# Patient Record
Sex: Male | Born: 2000 | Race: Black or African American | Hispanic: No | Marital: Single | Smoking: Never smoker
Health system: Southern US, Community
[De-identification: ages and names within clinical notes are randomized; demographics above are authoritative.]

## PROBLEM LIST (undated history)

## (undated) ENCOUNTER — Emergency Department (HOSPITAL_COMMUNITY): Admission: EM | Payer: Medicaid Other | Source: Home / Self Care

## (undated) DIAGNOSIS — J45909 Unspecified asthma, uncomplicated: Secondary | ICD-10-CM

---
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
## 2022-04-30 ENCOUNTER — Emergency Department (HOSPITAL_BASED_OUTPATIENT_CLINIC_OR_DEPARTMENT_OTHER): Payer: Medicaid Other | Admitting: Radiology

## 2022-04-30 ENCOUNTER — Encounter (HOSPITAL_BASED_OUTPATIENT_CLINIC_OR_DEPARTMENT_OTHER): Payer: Self-pay | Admitting: Emergency Medicine

## 2022-04-30 ENCOUNTER — Emergency Department (HOSPITAL_BASED_OUTPATIENT_CLINIC_OR_DEPARTMENT_OTHER)
Admission: EM | Admit: 2022-04-30 | Discharge: 2022-04-30 | Disposition: A | Discharge status: Home / Self Care | Payer: Medicaid Other | Attending: Emergency Medicine | Admitting: Emergency Medicine

## 2022-04-30 ENCOUNTER — Emergency Department (HOSPITAL_BASED_OUTPATIENT_CLINIC_OR_DEPARTMENT_OTHER): Payer: Medicaid Other

## 2022-04-30 ENCOUNTER — Other Ambulatory Visit: Payer: Self-pay

## 2022-04-30 DIAGNOSIS — S30811A Abrasion of abdominal wall, initial encounter: Secondary | ICD-10-CM | POA: Insufficient documentation

## 2022-04-30 DIAGNOSIS — S3991XA Unspecified injury of abdomen, initial encounter: Secondary | ICD-10-CM | POA: Diagnosis present

## 2022-04-30 DIAGNOSIS — Y92410 Unspecified street and highway as the place of occurrence of the external cause: Secondary | ICD-10-CM | POA: Insufficient documentation

## 2022-04-30 DIAGNOSIS — M542 Cervicalgia: Secondary | ICD-10-CM | POA: Insufficient documentation

## 2022-04-30 DIAGNOSIS — R519 Headache, unspecified: Secondary | ICD-10-CM | POA: Diagnosis not present

## 2022-04-30 LAB — COMPREHENSIVE METABOLIC PANEL
ALT: 7 U/L (ref 0–44)
AST: 18 U/L (ref 15–41)
Albumin: 5 g/dL (ref 3.5–5.0)
Alkaline Phosphatase: 77 U/L (ref 38–126)
Anion gap: 10 (ref 5–15)
BUN: 15 mg/dL (ref 6–20)
CO2: 28 mmol/L (ref 22–32)
Calcium: 10.4 mg/dL — ABNORMAL HIGH (ref 8.9–10.3)
Chloride: 102 mmol/L (ref 98–111)
Creatinine, Ser: 1.04 mg/dL (ref 0.61–1.24)
GFR, Estimated: >60 mL/min (ref >60)
Glucose, Bld: 81 mg/dL (ref 70–99)
Potassium: 3.9 mmol/L (ref 3.5–5.1)
Sodium: 140 mmol/L (ref 135–145)
Total Bilirubin: 0.6 mg/dL (ref 0.3–1.2)
Total Protein: 8.2 g/dL — ABNORMAL HIGH (ref 6.5–8.1)

## 2022-04-30 LAB — CBC WITH DIFFERENTIAL/PLATELET
Abs Immature Granulocytes: 0.02 10*3/uL (ref 0.00–0.07)
Basophils Absolute: 0 10*3/uL (ref 0.0–0.1)
Basophils Relative: 1 %
Eosinophils Absolute: 0.1 10*3/uL (ref 0.0–0.5)
Eosinophils Relative: 2 %
HCT: 42.9 % (ref 39.0–52.0)
Hemoglobin: 13.9 g/dL (ref 13.0–17.0)
Immature Granulocytes: 0 %
Lymphocytes Relative: 40 %
Lymphs Abs: 2.6 10*3/uL (ref 0.7–4.0)
MCH: 29.3 pg (ref 26.0–34.0)
MCHC: 32.4 g/dL (ref 30.0–36.0)
MCV: 90.5 fL (ref 80.0–100.0)
Monocytes Absolute: 0.4 10*3/uL (ref 0.1–1.0)
Monocytes Relative: 6 %
Neutro Abs: 3.3 10*3/uL (ref 1.7–7.7)
Neutrophils Relative %: 51 %
Platelets: 308 10*3/uL (ref 150–400)
RBC: 4.74 MIL/uL (ref 4.22–5.81)
RDW: 12 % (ref 11.5–15.5)
WBC: 6.4 10*3/uL (ref 4.0–10.5)
nRBC: 0 % (ref 0.0–0.2)

## 2022-04-30 MED ORDER — IOHEXOL 300 MG/ML  SOLN
100.0000 mL | Freq: Once | INTRAMUSCULAR | Status: AC | PRN
  Administered 2022-04-30: 75 mL via INTRAVENOUS

## 2022-04-30 NOTE — Discharge Instructions (Signed)
Your blood test CTs and x-rays did not show any serious internal injury.  Continue taking Tylenol and Motrin as needed for pain.  Follow-up with your doctor within the week.
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
